# Patient Record
Sex: Male | Born: 1950 | Race: White | Hispanic: No | Marital: Married | Smoking: Never smoker
Health system: Southern US, Community
[De-identification: ages and names within clinical notes are randomized; demographics above are authoritative.]

## PROBLEM LIST (undated history)

## (undated) DIAGNOSIS — G8220 Paraplegia, unspecified: Secondary | ICD-10-CM

## (undated) DIAGNOSIS — E119 Type 2 diabetes mellitus without complications: Secondary | ICD-10-CM

## (undated) DIAGNOSIS — N319 Neuromuscular dysfunction of bladder, unspecified: Secondary | ICD-10-CM

## (undated) HISTORY — PX: BACK SURGERY: SHX140

---

## 2021-05-15 ENCOUNTER — Emergency Department
Admission: EM | Admit: 2021-05-15 | Discharge: 2021-05-15 | Disposition: A | Discharge status: Home / Self Care | Payer: Medicare Other | Attending: Emergency Medicine | Admitting: Emergency Medicine

## 2021-05-15 DIAGNOSIS — T83010A Breakdown (mechanical) of cystostomy catheter, initial encounter: Secondary | ICD-10-CM | POA: Insufficient documentation

## 2021-05-15 HISTORY — DX: Paraplegia, unspecified: G82.20

## 2021-05-15 HISTORY — DX: Neuromuscular dysfunction of bladder, unspecified: N31.9

## 2021-05-15 HISTORY — DX: Type 2 diabetes mellitus without complications: E11.9

## 2021-05-15 NOTE — ED Provider Notes (Signed)
History     Chief Complaint   Patient presents with    Foley problem     HPI   71 year old  male patient presents emergency department with neurogenic bladder and a history of suprapubic catheter.  Patient says that since last night his suprapubic catheter has not had any output; denies any fever or chills.  Past Medical History:   Diagnosis Date    Diabetes mellitus     Neurogenic bladder     Paraplegia        Past Surgical History:   Procedure Laterality Date    BACK SURGERY         History reviewed. No pertinent family history.    Social  Social History     Tobacco Use    Smoking status: Never     Passive exposure: Never    Smokeless tobacco: Never   Vaping Use    Vaping status: Never Used   Substance Use Topics    Alcohol use: Yes     Comment: occassional    Drug use: Yes     Comment: marijuana daily       .     Allergies   Allergen Reactions    Restoril [Temazepam] Hallucinations       Home Medications               allopurinol (ZYLOPRIM) 100 MG tablet     Take 100 mg by mouth daily     aspirin EC 81 MG EC tablet     Take 81 mg by mouth daily     baclofen 0.05 MG/ML SOLN baclofen (LIORESAL)     by Intrathecal route continuous     collagenase (SANTYL) ointment     Apply topically daily     docusate sodium (COLACE) 100 MG capsule     Take 100 mg by mouth 2 (two) times daily     glipiZIDE (GLUCOTROL) 10 MG tablet     Take 10 mg by mouth 2 (two) times daily before meals     insulin aspart (NovoLOG) 100 UNIT/ML injection     Inject into the skin 3 (three) times daily before meals Sliding scale     insulin glargine (LANTUS) 100 UNIT/ML injection     Inject into the skin     lactulose (CHRONULAC) 10 GM/15ML solution     Take 20 g by mouth every 6 (six) hours     zolpidem (AMBIEN CR) 12.5 MG CR tablet     Take 12.5 mg by mouth nightly as needed for Sleep             Review of Systems   Genitourinary:         Foley not draining   All other systems reviewed and are negative.      Physical Exam    BP: 117/54, Heart  Rate: 91, Temp: 98.7 F (37.1 C), Resp Rate: 16, SpO2: 97 %    Physical Exam  Vitals reviewed.   Constitutional:       General: He is not in acute distress.  HENT:      Head: Normocephalic and atraumatic.      Right Ear: External ear normal.      Left Ear: External ear normal.      Nose: Nose normal.   Eyes:      General:         Right eye: No discharge.         Left eye: No  discharge.      Conjunctiva/sclera: Conjunctivae normal.   Abdominal:      Comments: Suprapubic catheter in situ.  No drainage noted.   Musculoskeletal:      Cervical back: Normal range of motion and neck supple.   Neurological:      General: No focal deficit present.      Mental Status: He is alert and oriented to person, place, and time. Mental status is at baseline.   Psychiatric:         Mood and Affect: Mood normal.         Behavior: Behavior normal.           MDM and ED Course     ED Medication Orders (From admission, onward)      None               Medical Decision Making  71 year old  male patient presents emergency department with neurogenic bladder and a history of suprapubic catheter.  Patient says that since last night his suprapubic catheter has not had any output; denies any fever or chills.  Differential includes clogged catheter with sediment, displacement of the catheter itself.  Patient's catheter was flushed with 100 mL however there was no return.  Afterwards the balloon for the catheter was deflated and Foley inserted deeper.  Urine return was noted.  It is likely that patient pulled on the catheter and it was not in place given the return of urine on deeper insertion of the Foley catheter.  Patient is not having any other symptoms.  He is afebrile with a temperature of 98.6.  Patient [place]  Strict return precautions given.    Problems Addressed:  Suprapubic catheter dysfunction, initial encounter: complicated acute illness or injury     Details: In the setting of  paraplegia.    Amount and/or Complexity of Data Reviewed  External Data Reviewed: labs, radiology and notes.     Details: Reviewed office visit on 05/02/2021 for ostomy at [facility] healing [facility].Reviewed admission to outside facility with diabetes type 2 with uncontrolled blood sugars and concern for worsening ischial decubitus ulcers with concerns for osteomyelitis.  Patient with a sed rate of 50 and CRP of 177.CT of the abdomen pelvis noted a large right ischial tuberosity ulcer improved since 03/2020.    Risk  Prescription drug management.  Risk Details: Reviewed and no changes made.               Patient/family given red flag/return precautions and were discussed in detail. All questions were answered. Advised to present to the emergency room if condition worsens. Patient/family indicates understanding of instructions given and agrees with plan.    This note was generated within the [employer] EMR using Dragon medical speech recognition software and may contain inherent errors or omissions not intended by the user. Grammatical and punctuation errors, random word insertions, deletions, pronoun errors and incomplete sentences are occasional consequences of this technology due to software limitations. Not all errors are caught or corrected.  Although every attempt is made to root out erroneus and incomplete transcription, the note may still not fully represent the intent or opinion of the author. If there are questions or concerns about the content of this note or information contained within the body of this dictation they should be addressed directly with the author for clarification.        Procedures    Clinical Impression &  Disposition     Clinical Impression  Final diagnoses:   Suprapubic catheter dysfunction, initial encounter        ED Disposition       ED Disposition   Discharge    Condition   --    Date/Time   Wed May 15, 2021  2:40 PM    [place] discharge to home/self  care.    Condition at disposition: Stable                  Discharge Medication List as of 05/15/2021  2:42 PM                      Yvetta Coder, MD  05/29/21 1731

## 2021-05-15 NOTE — ED Triage Notes (Signed)
Patient arrives POV while on vacation for a possible UTI.  Patient is a paraplegic d/t multiple back surgeries and spinal stenosis and has a 37fsuprapubic catheter and a colostomy.  Patient states that his catheter was changed last Tuesday and he usually ends up with a UTI after changes which causes the catheter to get clogged.  Patient states that he normally flushes his catheter with vinegar water, but because he is on vacation he does not have his big syringe.  Patient states that his urine has been cloudy and he has not had any output from his catheter.  Patient alert and oriented, RR even and unlabored.  No acute distress noted.

## 2021-05-15 NOTE — ED Notes (Signed)
Suprapubic foley catheter flushed with 170ms of normal saline with no return. Balloon deflated, foley inserted deeper into bladder until urine return was noted.  Balloon was reinflated.  Patient with adequate UOP at this time.

## 2022-04-21 DEATH — deceased
